# Patient Record
Sex: Male | Born: 1995 | Race: White | Hispanic: No | Marital: Single | Smoking: Current every day smoker
Health system: Southern US, Community
[De-identification: ages and names within clinical notes are randomized; demographics above are authoritative.]

---
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
## 2018-03-20 ENCOUNTER — Encounter: Payer: Self-pay | Admitting: Emergency Medicine

## 2018-03-20 ENCOUNTER — Ambulatory Visit
Admission: EM | Admit: 2018-03-20 | Discharge: 2018-03-20 | Disposition: A | Discharge status: Home / Self Care | Payer: Medicaid Other | Attending: Internal Medicine | Admitting: Internal Medicine

## 2018-03-20 ENCOUNTER — Other Ambulatory Visit: Payer: Self-pay

## 2018-03-20 DIAGNOSIS — S60361A Insect bite (nonvenomous) of right thumb, initial encounter: Secondary | ICD-10-CM | POA: Diagnosis not present

## 2018-03-20 DIAGNOSIS — W57XXXA Bitten or stung by nonvenomous insect and other nonvenomous arthropods, initial encounter: Secondary | ICD-10-CM | POA: Diagnosis not present

## 2018-03-20 DIAGNOSIS — M7989 Other specified soft tissue disorders: Secondary | ICD-10-CM

## 2018-03-20 MED ORDER — NAPROXEN 500 MG PO TABS: 500.0000 mg | ORAL_TABLET | Freq: Two times a day (BID) | ORAL | 0 refills | Status: AC | PRN

## 2018-03-20 MED ORDER — DOXYCYCLINE HYCLATE 100 MG PO CAPS: 100.0000 mg | ORAL_CAPSULE | Freq: Two times a day (BID) | ORAL | 0 refills | Status: AC

## 2018-03-20 NOTE — ED Triage Notes (Signed)
Patient stated he has a spider bite on right thumb that happen last night. The pain woke him up from sleep. Now having joint pain and swelling.Marland Kitchen

## 2018-03-20 NOTE — Discharge Instructions (Signed)
Recommend start Doxycyline 100mg  twice a day as directed. May take Naproxen 500mg  twice a day as needed for pain. Apply ice to area to help with swelling. May alternate with heat for comfort. Follow-up here if minimal improvement within 2 days or go to the ER if pain and swelling worsens.

## 2018-03-20 NOTE — ED Provider Notes (Signed)
[facility]    CSN: [id number] Arrival date & time: 03/20/18  1655     History   Chief Complaint Chief Complaint  Patient presents with  . Insect Bite    HPI Wayne Gardner is a 22 y.o. male.   22 year old male presents with right thumb pain and swelling that started around 3am last night. He woke up to the pain and noticed a bite mark on his right thumb below his mid-joint. He did not see a spider or other insect that caused the bite. His thumb has continued to get worse with increased swelling and difficulty bending his thumb due to the pain and swelling. He has taken Tylenol and Ibuprofen with minimal relief. He denies any fever, headaches, rash or any other bite marks. He does smoke cigarettes daily but denies any chronic health issues and takes no daily medication.   The history is provided by the patient.    History reviewed. No pertinent past medical history.  There are no active problems to display for this patient.   History reviewed. No pertinent surgical history.     Home Medications    Prior to Admission medications   Medication Sig Start Date End Date Taking? Authorizing Provider  doxycycline (VIBRAMYCIN) 100 MG capsule Take 1 capsule (100 mg total) by mouth 2 (two) times daily. 03/20/18   Sudie Grumbling, NP  naproxen (NAPROSYN) 500 MG tablet Take 1 tablet (500 mg total) by mouth 2 (two) times daily as needed for moderate pain. 03/20/18   Sudie Grumbling, NP    Family History History reviewed. No pertinent family history.  Social History Social History   Tobacco Use  . Smoking status: Current Every Day Smoker    Types: Cigarettes  . Smokeless tobacco: Current User  Substance Use Topics  . Alcohol use: Never    Frequency: Never  . Drug use: Never     Allergies   Patient has no known allergies.   Review of Systems Review of Systems  Constitutional: Negative for activity change, appetite change, chills, fatigue and fever.  HENT:  Negative for facial swelling, sneezing, sore throat and trouble swallowing.   Respiratory: Negative for cough, chest tightness, shortness of breath and wheezing.   Gastrointestinal: Negative for nausea and vomiting.  Musculoskeletal: Positive for arthralgias and joint swelling. Negative for back pain and neck pain.  Skin: Positive for color change. Negative for rash and wound.  Allergic/Immunologic: Negative for environmental allergies and immunocompromised state.  Neurological: Negative for dizziness, tremors, seizures, syncope, weakness, light-headedness, numbness and headaches.  Hematological: Negative for adenopathy. Does not bruise/bleed easily.  Psychiatric/Behavioral: Negative.      Physical Exam Triage Vital Signs ED Triage Vitals  Enc Vitals Group     BP 03/20/18 1721 (!) 109/59     Pulse Rate 03/20/18 1721 78     Resp 03/20/18 1721 18     Temp 03/20/18 1721 98.6 F (37 C)     Temp Source 03/20/18 1721 Oral     SpO2 03/20/18 1721 99 %     Weight 03/20/18 1724 115 lb (52.2 kg)     Height 03/20/18 1724 5\' 4"  (1.626 m)     Head Circumference --      Peak Flow --      Pain Score 03/20/18 1723 8     Pain Loc --      Pain Edu? --      Excl. in GC? --  No data found.  Updated Vital Signs BP (!) 109/59 (BP Location: Left Arm)   Pulse 78   Temp 98.6 F (37 C) (Oral)   Resp 18   Ht 5\' 4"  (1.626 m)   Wt 115 lb (52.2 kg)   SpO2 99%   BMI 19.74 kg/m   Visual Acuity Right Eye Distance:   Left Eye Distance:   Bilateral Distance:    Right Eye Near:   Left Eye Near:    Bilateral Near:     Physical Exam  Constitutional: He is oriented to person, place, and time. Vital signs are normal. He appears well-developed and well-nourished. He is cooperative. No distress.  HENT:  Head: Normocephalic and atraumatic.  Eyes: Conjunctivae and EOM are normal.  Neck: Normal range of motion.  Cardiovascular: Normal rate.  Pulmonary/Chest: Effort normal.  Musculoskeletal: He  exhibits tenderness.       Right hand: He exhibits decreased range of motion, tenderness and swelling. He exhibits normal capillary refill and no laceration. Normal sensation noted. Normal strength noted.       Hands: Punctate mark present just below right DIP joint. Swelling present from base of nail to PIP joint. Redness present from DIP to PIP joint and very tender. Minimal flexion at distal joint. Good pulses and capillary refill. No neuro deficits noted.   Neurological: He is alert and oriented to person, place, and time. He has normal strength. No sensory deficit.  Skin: Skin is warm, dry and intact. Capillary refill takes less than 2 seconds. No rash noted. There is erythema.  Psychiatric: He has a normal mood and affect. His behavior is normal. Judgment and thought content normal.  Vitals reviewed.    UC Treatments / Results  Labs (all labs ordered are listed, but only abnormal results are displayed) Labs Reviewed - No data to display  EKG None  Radiology No results found.  Procedures Procedures (including critical care time)  Medications Ordered in UC Medications - No data to display  Initial Impression / Assessment and Plan / UC Course  I have reviewed the triage vital signs and the nursing notes.  Pertinent labs & imaging results that were available during my care of the patient were reviewed by me and considered in my medical decision making (see chart for details).    Discussed that he probably has a mild allergic reaction to the insect bite but may be the beginning of mild cellulitis. Offered Toradol IM for pain but pt declined. Recommend start antibiotic (Doxycycline) as directed. Take Naproxen as needed for pain. Follow-up as planned in discharge instructions.  Final Clinical Impressions(s) / UC Diagnoses   Final diagnoses:  Insect bite of right thumb, initial encounter  Swelling of right thumb     Discharge Instructions     Recommend start Doxycyline  100mg  twice a day as directed. May take Naproxen 500mg  twice a day as needed for pain. Apply ice to area to help with swelling. May alternate with heat for comfort. Follow-up here if minimal improvement within 2 days or go to the ER if pain and swelling worsens.     ED Prescriptions    Medication Sig Dispense Auth. Provider   doxycycline (VIBRAMYCIN) 100 MG capsule Take 1 capsule (100 mg total) by mouth 2 (two) times daily. 20 capsule Sudie GrumblingAmyot, Berniece Abid Berry, NP   naproxen (NAPROSYN) 500 MG tablet Take 1 tablet (500 mg total) by mouth 2 (two) times daily as needed for moderate pain. 14 tablet Sudie GrumblingAmyot, Monta Maiorana Berry,  NP     Controlled Substance Prescriptions [place] Controlled Substance Registry consulted? Not Applicable   Sudie Grumbling, NP 03/21/18 0600
